# Patient Record
Sex: Male | Born: 1985 | Race: White | Hispanic: No | Marital: Single | Smoking: Current some day smoker
Health system: Southern US, Community
[De-identification: ages and names within clinical notes are randomized; demographics above are authoritative.]

## PROBLEM LIST (undated history)

## (undated) DIAGNOSIS — R569 Unspecified convulsions: Secondary | ICD-10-CM

---
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
## 2007-03-07 ENCOUNTER — Ambulatory Visit: Payer: Self-pay | Admitting: Thoracic Surgery

## 2007-04-25 ENCOUNTER — Ambulatory Visit: Payer: Self-pay | Admitting: Thoracic Surgery

## 2007-10-08 IMAGING — CT CT CHEST W/O CM
2 of 3 series · 16 of 36 positions shown, 20 images · IV contrast (agent unspecified)
Comparison: Chest x-ray 07/07/06.

CLINICAL DATA: Left anterior rib pain.  
 CT CHEST WITHOUT CONTRAST:
TECHNIQUE: Multidetector CT imaging of the chest was performed following the standard protocol without IV contrast.

[Series 3: routine chest · axial · 0.70mm/px · z∈[-351,-46]mm · 13 of 70 slices shown, 17 images]
[im 6/70  mediastinal]
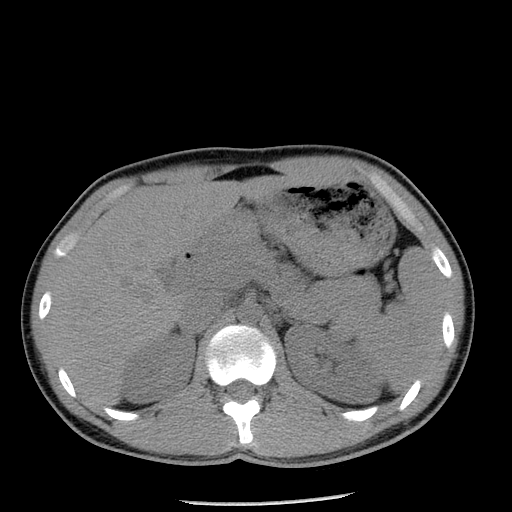
[im 6/70  lung]
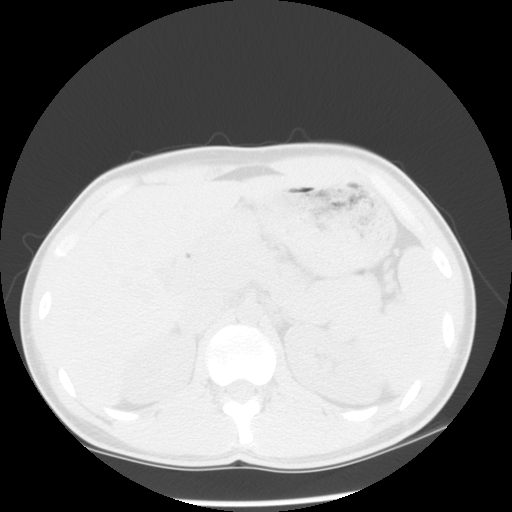
[im 11/70  lung]
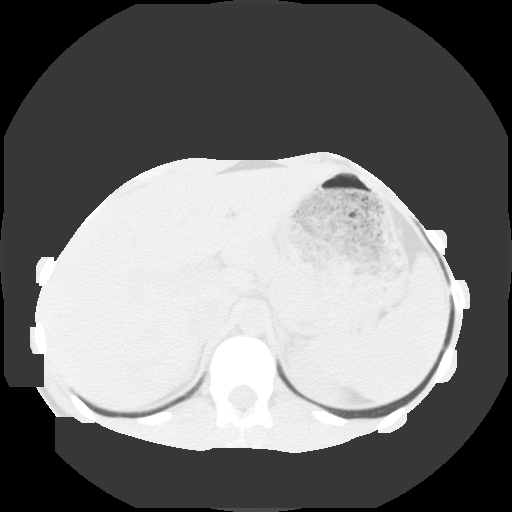
[im 16/70  lung]
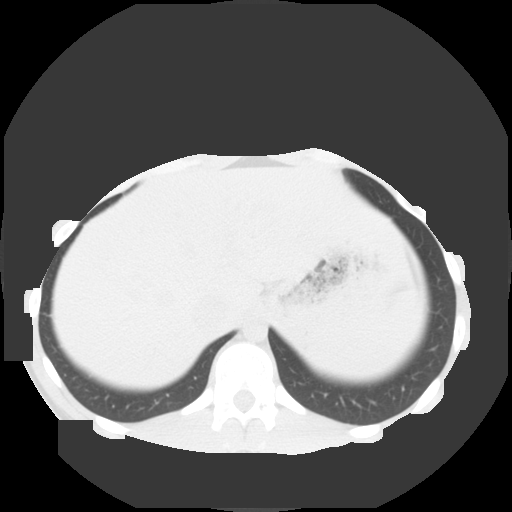
[im 21/70  lung]
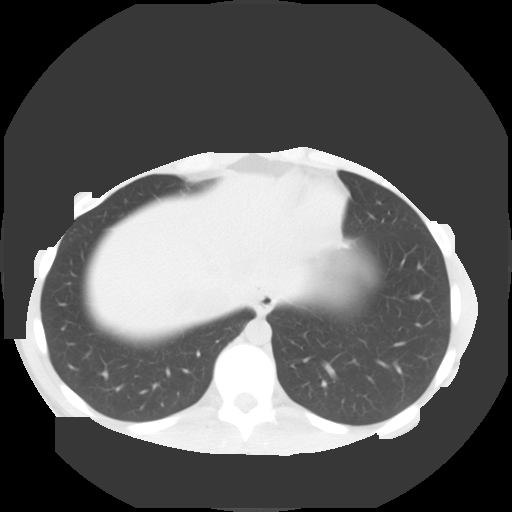
[im 26/70  mediastinal]
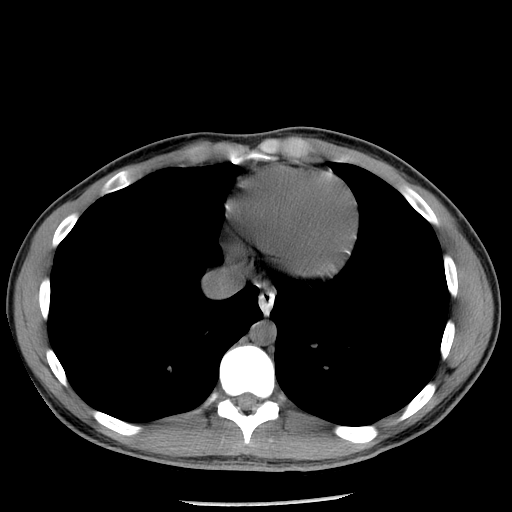
[im 26/70  lung]
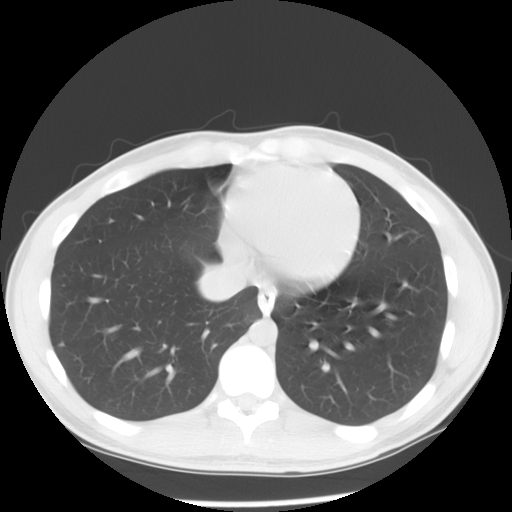
[im 31/70  lung]
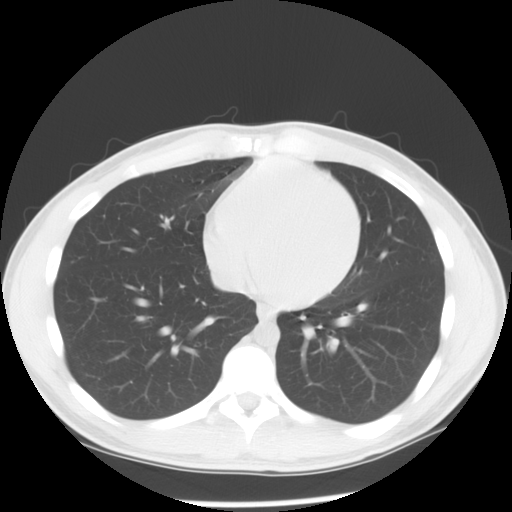
[im 36/70  lung]
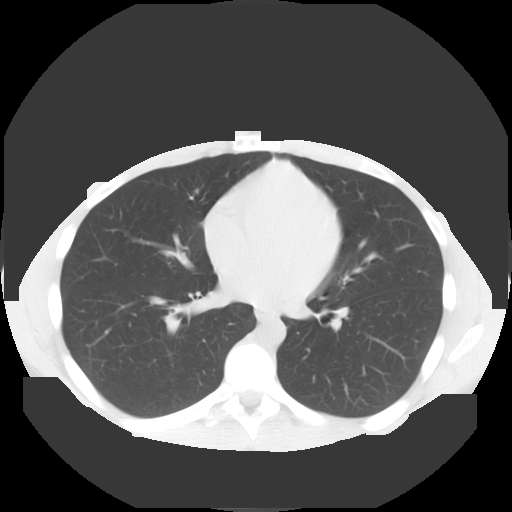
[im 41/70  lung]
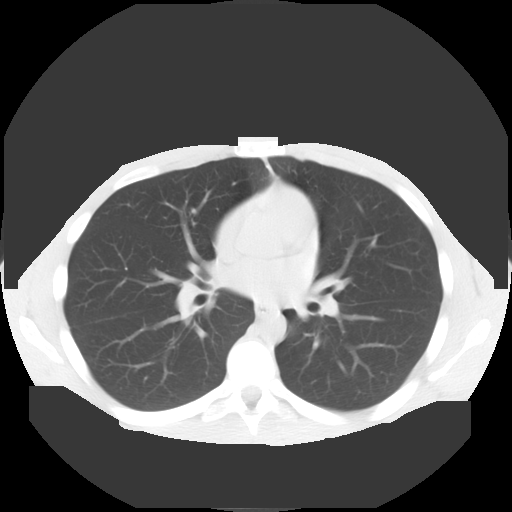
[im 47/70  mediastinal]
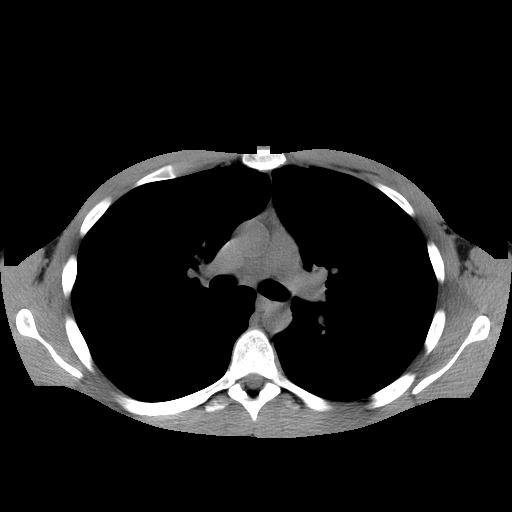
[im 47/70  lung]
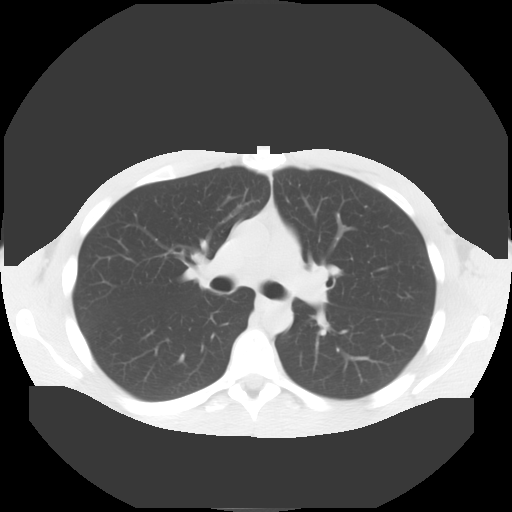
[im 52/70  lung]
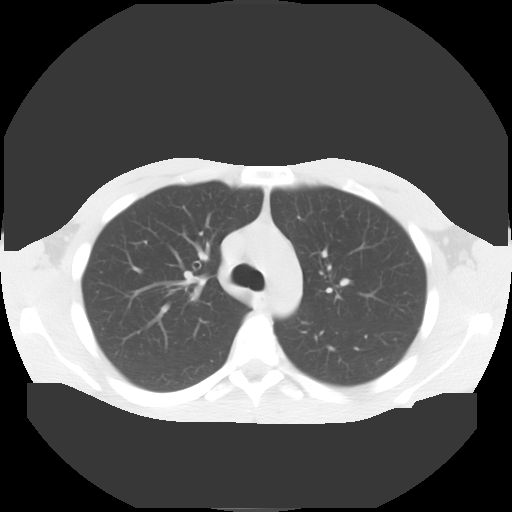
[im 57/70  lung]
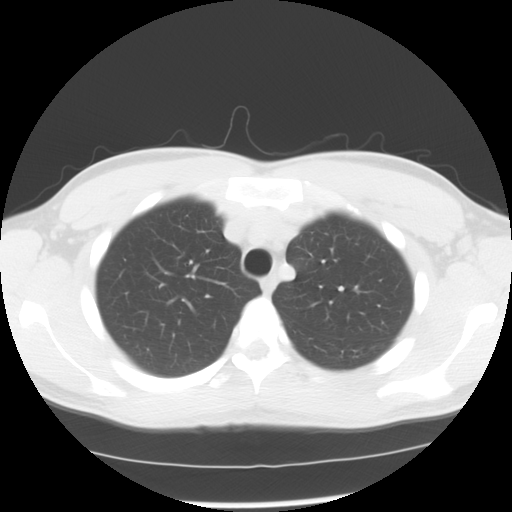
[im 62/70  lung]
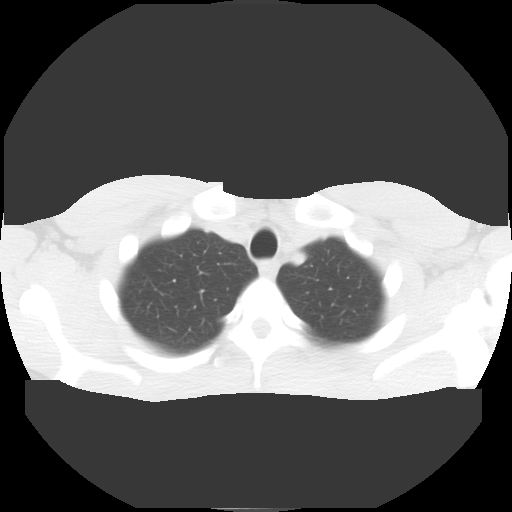
[im 67/70  mediastinal]
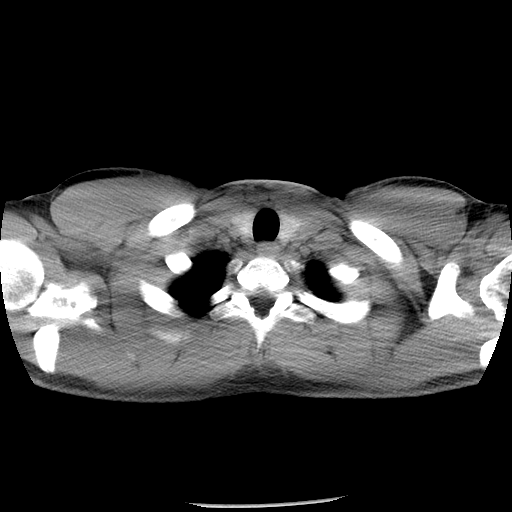
[im 67/70  lung]
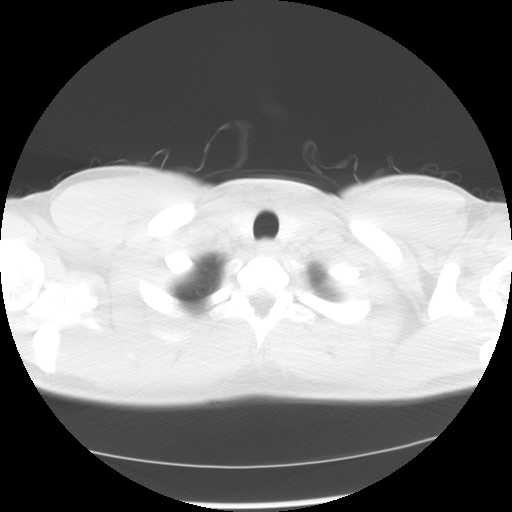

[Series 602: sagittal body · sagittal · 0.70mm/px · 3 of 145 slices shown]
[im 29/145  lung]
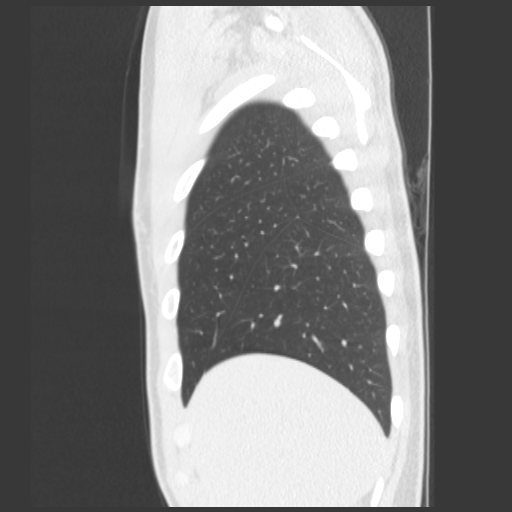
[im 58/145  lung]
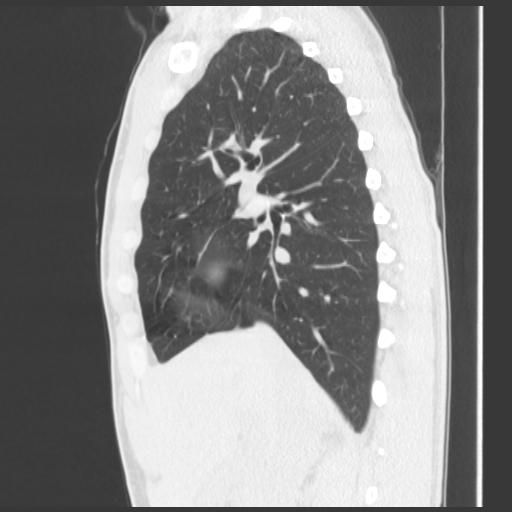
[im 87/145  lung]
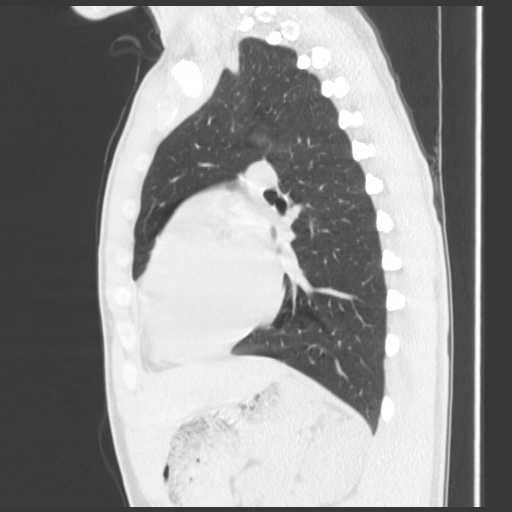

[16 of 36 positions shown; findings below may reference images not displayed]

FINDINGS: The lungs are clear without infiltrate, effusion, or mass.  The ribs appear normal with particular attention to the left ribs.  No mass, fracture, or other lesion is identified.  No soft tissue mass was identified.  The cardiac and mediastinal contours are normal.
IMPRESSION: Negative.

## 2011-02-01 NOTE — Letter (Signed)
March 07, 2007   Erasmo Leventhal, M.D.  [facility]  [address]  [address]  [place], [place] [address]   Re:  Edward Frost, Edward Frost                DOB:  08-08-1986   Dear Mardelle Matte:   I saw Mr. Alves in the office today.  You apparently did a right  shoulder surgery on him recently and he comes in complaining of left  lower costal margin pain and tenderness in his left costal margin.  He  was seen by Dr. Ethelene Hal who did multiple tests including thoracic and  lumbar MRI and a CT scan of the chest.  The patient says that his left  costal margin is more prominent than the right costal margin.  He has  had one episode of being on a Medrol Dosepak that he said helped some,  and he talks about having some type of injections in this particular  area.  No history of erythema or trauma.   PAST MEDICAL HISTORY:  Significant for a fractured jaw, recent diagnosis  of depression, apparently has been diagnosed with adult hyperactivity  disorder (ADHD).  His that he takes are Aleve twice a day, he is on  Lexapro 20 mg a day, Adderall 10 mg a day, and doxycycline 5 mg a day,  and multivitamins.  He has had no allergies.   FAMILY HISTORY:  Noncontributory.  He is single, works as a Land and part-time work.  Does not smoke, quit smoking 2 years ago,  and does drink alcohol on a regular basis.   REVIEW OF SYSTEMS:  Weight 178 pounds.  He is 6 feet.  CARDIAC:  No  angina or atrial fibrillation.  PULMONARY:  No hemoptysis, fever,  chills, productive cough or asthma.  GI:  No nausea, vomiting,  constipation, GERD, or peptic ulcer disease.  GU:  No dysuria, frequent  urination, or kidney disease.  VASCULAR:  No claudication, DVT, TIAs.  NEUROLOGIC:  No dizziness, headache, blackout, or seizures.  MUSCULOSKELETAL:  See history of present illness.  PSYCHIATRIC:  See  past medical history.  EYE/ENT:  No change in his eyesight or hearing.  HEMATOLOGIC:  No problems with bleeding.   PHYSICAL EXAMINATION:  GENERAL:  This is a well-developed Caucasian male  in no acute distress.  VITAL SIGNS:  His blood pressure is 119/66, pulse 88, respirations 18,  saturations were 98%.  HEENT:  Unremarkable.  CHEST:  Clear to auscultation and percussion.  HEART:  Regular sinus rhythm, no murmurs.  There is a slight prominence  of his left costal margin and some mild tenderness over the left costal  margin and the costal cartilage back to the anterior axillary line.  No  definite palpable masses.  No erythema.  No definite point tenderness.  ABDOMEN:  Soft.  There is no hepatosplenomegaly.  Pulses 2+.  There is  no clubbing or edema.  NEUROLOGIC:  He is oriented x3.  Sensory and motor are intact.  Cranial  nerves are intact.   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  I  explained to him that it takes a long time for this to resolve and is  best treated with nonsteroidal medication, occasionally steroids or  trigger point injections.  I gave him a prescription for Celebrex 200 mg  twice a day and will see him back in 3 weeks to check on his status.  Ines Bloomer, M.D.  Electronically Signed   DPB/MEDQ  D:  03/07/2007  T:  03/07/2007  Job:  [id number]   cc:   Caralyn Guile. Ethelene Hal, M.D.

## 2011-02-01 NOTE — Letter (Signed)
April 25, 2007   _Dr. Valma Cava   Re:  Edward Frost, Edward Frost                DOB:  1986/01/24   Dear Dr. Thomasena Edis :   I saw Edward Frost back in the office today.  His blood pressure is  124/83, pulse 88, respirations 18, temperature 98%.  He is still  complaining of costal pain but only on the left side and not as much on  the right and some back pain.  The Celebrex has stabilized it, but it  has not completely relieved it and increases with activity.  He does say  that when he got some injections that that helped for a short period of  time as well as a Medrol Dosepak.  So, I told him to continue on his  Celebrex twice a day and would see him back in 4 weeks, but I did tell  him again that this would take a long time for this to resolve.   Sincerely,   Ines Bloomer, M.D.  Electronically Signed   DPB/MEDQ  D:  04/25/2007  T:  04/26/2007  Job:  [id number]

## 2011-02-04 NOTE — Op Note (Signed)
[name], Edward Frost              ACCOUNT NO.:  [id number]   MEDICAL RECORD NO.:  [id number]          PATIENT TYPE:  AMB   LOCATION:  DSC                          FACILITY:  [facility]   PHYSICIAN:  Kinnie Scales. Annalee Genta, M.D.DATE OF BIRTH:  [birth year]   DATE OF PROCEDURE:  04/18/2005  DATE OF DISCHARGE:                                 OPERATIVE REPORT   PRE- AND POSTOPERATIVE DIAGNOSES:  1.  Mandibular fracture (Feb 13, 2005).  2.  Status post open reduction with mandibular maxillary fixation.   SURGICAL PROCEDURES:  Removal of mandibular maxillary fixation hardware.   SURGEON:  Dr. Annalee Genta.   ANESTHESIA:  MAC/local.   No specimen.   BLOOD LOSS:  Minimal.   Patient transferred to the operating room to recovery in stable condition.  No complications.   BRIEF HISTORY:  Zac is a 25 year old white male who suffered a mandibular  fracture while playing baseball. He was seen on Feb 13, 2005 and underwent  open reduction and mandibular maxillary fixation of mandible fracture on the  night of his initial injury.  Unfortunately,  the patient developed severe  gastritis with nausea and vomiting.  Mandibular wiring had to be released  and he was returned the operating room approximately 1 week later for  replacement of his mandibular maxillary wiring.  The patient did well  subsequently and tolerated liquid oral diet without complication or  difficulty.  Approximately six weeks after original injury, follow-up  Panorex was performed which showed good reduction of the fracture and he was  scheduled for removal of mandibular maxillary hardware. Risks, benefits and  possible complications of this procedure were discussed in detail with his  parents who understood and concurred with our plan for surgery which was  scheduled as above.   SURGICAL PROCEDURE:  The patient was brought to the operating room on April 18, 2005, placed in supine position on the operating table.  Monitored  anesthesia  care was administered via intravenous sedation.  The patient was  injected with approximately 3 mL of 1% lidocaine which was injected in the  soft tissue mucosa in the mandible and maxilla overlying the surgical  screws.  After allowing adequate time for vasoconstriction and hemostasis,  individual separate stab incisions were created overlying the screws and the  stainless steel hardware was removed  without difficulty. The oral cavity and oropharynx were then irrigated and  suctioned.  Airway was stable throughout the surgical procedure. The patient  then awakened from the anesthetic and transferred to the operating room to  recovery room in stable condition. No complications. Blood loss minimal.       DLS/MEDQ  D:  16/06/9603  T:  04/18/2005  Job:  [id number]

## 2011-02-04 NOTE — Op Note (Signed)
NAMEROBY, DONAWAY              ACCOUNT NO.:  [id number]   MEDICAL RECORD NO.:  [id number]          PATIENT TYPE:  AMB   LOCATION:  DSC                          FACILITY:  [facility]   PHYSICIAN:  Kinnie Scales. Annalee Genta, M.D.DATE OF BIRTH:  August 13, 1986   DATE OF PROCEDURE:  02/23/2005  DATE OF DISCHARGE:                                 OPERATIVE REPORT   PRE- AND POSTOPERATIVE DIAGNOSIS:  1.  Mandibular fracture.  2.  Status post open reduction and mandibular maxillary fixation (Feb 13 2005).   SURGICAL PROCEDURE:  Rewiring of mandibular maxillary fixation.   ANESTHESIA:  General nasal tracheal.   SURGEON:  Dr. Annalee Genta.   COMPLICATIONS:  None.   BLOOD LOSS:  Less than 50 cc.   Patient transferred from the operating room to recovery in stable condition.   BRIEF HISTORY:  Edward Frost is a 25 year old white male who suffered a right  anterior mandibular fracture on Feb 13, 2005 while playing baseball. He was  evaluated in the emergency department at [facility], he underwent  open reduction and mandibular maxillary fixation of mandibular fracture on  the evening of Feb 13, 2005.  The patient was discharged to home the  following morning and was stable until February 19, 2005.  The patient of acute  nausea and vomiting which necessitated cutting his mandibular maxillary  fixation. The patient had severe nausea and vomiting thought to be a viral  illness and he was admitted to [facility] in [place], under Dr.  Claudean Kinds care.  The patient had a mildly elevated white blood cell count  and low grade fever. He was placed on oral antibiotics and medications to  control nausea. He was well hydrated with intravenous fluids and was  discharged on the morning of February 23, 2005. The patient was returned the  operating room for open reduction and rewiring of mandibular maxillary  fixation as an outpatient at [facility] day surgical center.  The  risks, benefits and possible  complications of the above surgical procedure  were discussed in detail with Edward Frost and his parents and they understood  and concurred with our plan for surgery which was scheduled as above.   PROCEDURE:  The patient brought to the operating room on February 23, 2005 and  intravenous sedation was initiated.  The patient's oral cavity, oropharynx  were examined. The mandibular fracture was not significantly displaced and  the previously placed mandibulomaxillary screws were in good position and  localized.  In order to facilitate rewiring with the least amount of stress  on the patient, nasotracheal intubation was undertaken. Once adequate  anesthesia been established and airway stabilized, mandibulomaxillary  fixation was undertaken by rewiring with 24 and 26 gauge wires across the  previously placed fixation screws.  With the patient in good occlusion and  fixated, the patient's oral cavity and oropharynx were irrigated and  suctioned. Nasotracheal suction was undertaken and the  nasopharynx was thoroughly suctioned of bloody debris.  The patient was then  awakened  from the anesthetic and was extubated without difficulty.  He was  transferred from the operating room to recovery in stable condition. No  complications. Blood loss less than 50 cc.      DLS/MEDQ  D:  16/06/9603  T:  02/23/2005  Job:  [id number]

## 2011-02-04 NOTE — Discharge Summary (Signed)
NAMETRIP, CAVANAGH              ACCOUNT NO.:  [id number]   MEDICAL RECORD NO.:  [id number]          PATIENT TYPE:  INP   LOCATION:  6120                         FACILITY:  [facility]   PHYSICIAN:  Kinnie Scales. Annalee Genta, M.D.DATE OF BIRTH:  09-14-1986   DATE OF ADMISSION:  02/13/2005  DATE OF DISCHARGE:  02/14/2005                                 DISCHARGE SUMMARY   ADMISSION DIAGNOSIS:  Right mandibular fracture.   DISCHARGE DIAGNOSIS:  Right mandibular fracture.   SURGICAL PROCEDURE:  Open reduction, mandibulomaxillary fixation (Feb 13, 2005).  The patient discharged to home in stable condition in the company of  his parents.   DISCHARGE MEDICATIONS:  1.  Augmentin 40 mg per 5 mL 2 tsp b.i.d. x 10 days.  2.  Lortab elixir 2-3 tsp q.4-6h. p.r.n. pain, dispense 300 mL without      refill.  3.  Also use a liquid multivitamin.  4.  Liquid Motrin 600 mg p.o. t.i.d.  5.  Claritin p.r.n.   The patient has dietary restrictions, clear and thin liquids only.  Activities limited.  No lifting, straining, or vigorous physical exercise.  No trauma to the jaw.  No driving for 2 weeks.   MOUTH CARE:  Half-strength hydrogen peroxide mouth rinse on a twice daily  basis and gentle tooth brushing.   WIRE CUTTERS DISCHARGED WITH THE PATIENT AND TO BE KEPT WITH THE PATIENT AT  ALL TIMES.   The patient will follow up in my office in 2 weeks for postoperative care.  Phone number [phone] or sooner as needed for emergencies.   BRIEF HISTORY:  [name] is a 25 year old white male, who was playing baseball  in the evening of Feb 13, 2005.  He was struck in the right jaw with a  thrown baseball.  He presented to the [facility] Emergency  Department for evaluation, complaining of intraoral bleeding, swelling, and  significant discomfort.  Panorex x-ray was performed in the emergency room  which showed a partially displaced right mental mandibular fracture and a  minimally displaced right  subcondylar fracture.  Given the patient's  history, examination, and physical findings, I recommended that we undertake  open reduction and mandibulomaxillary fixation of the mandible fracture.  Risks, benefits, and possible complications of this procedure were discussed  in detail with the patient's parents, who understood and concurred with our  plan for surgery which was scheduled on a semi-urgent basis on the evening  of Feb 13, 2005.   HOSPITAL COURSE:  The patient was admitted to the ENT service under Dr.  Thurmon Fair care on Feb 13, 2005.  He was taken to the operating room at  [facility] and under general anesthesia, underwent uneventful,  uncomplicated open reduction mandibulomaxillary fixation of displaced  mandible fracture.  The patient was transferred from the operating room to  the recovery room in stable condition and from recovery to unit 6100 for  further postoperative care.   On the first postoperative morning, a Panorex was performed.  The patient  had good reduction of his fractures and good mandibular alignment.  He had  minimal swelling and complained of moderate discomfort which was controlled  with his narcotic pain medication.  The patient was tolerating a liquid diet  and was discharged to home in stable condition with the above discharge  instructions in the company of his parents.  His family is comfortable with  his discharge planning and will follow up in 2 weeks for postoperative care  or sooner as warranted.      DLS/MEDQ  D:  16/06/9603  T:  02/14/2005  Job:  [id number]

## 2011-02-04 NOTE — Op Note (Signed)
Edward Frost, Edward Frost              ACCOUNT NO.:  [id number]   MEDICAL RECORD NO.:  [id number]          PATIENT TYPE:  INP   LOCATION:  1824                         FACILITY:  [facility]   PHYSICIAN:  Kinnie Scales. Annalee Genta, M.D.DATE OF BIRTH:  07-03-1986   DATE OF PROCEDURE:  02/13/2005  DATE OF DISCHARGE:                                 OPERATIVE REPORT   PREOPERATIVE DIAGNOSIS:  Displaced right mandibular fracture.   POSTOPERATIVE DIAGNOSIS:  Displaced right mandibular fracture.   INDICATION FOR SURGERY:  Displaced right mandibular fracture.   SURGICAL PROCEDURES:  Open reduction and mandibulomaxillary fixation of  mandibular fracture.   ANESTHESIA:  General nasotracheal.   SURGEON:  David L. Annalee Genta, M.D.   COMPLICATIONS:  None.   ESTIMATED BLOOD LOSS:  Less than 50 mL.   CONDITION:  Patient transferred to the operating room to the recovery room  in stable condition.   BRIEF HISTORY:  [name] is a 25 year old white male who presented to the [facility] Emergency Department on Feb 13, 2005 after suffering an injury  while playing baseball.  The patient was struck in the region of the right  chin by thrown baseball. He had acute bleeding from the oral cavity and  pain; he was brought to the [facility] Emergency Department and  Panorex x-ray showed a displaced open right anterior mandibular fracture and  a right subcondylar fracture which was minimally displaced.  Given the  patient's history, examination and findings, I recommended that we undertake  open reduction and fixation of the fracture.  Examination in the ER showed  an intraoral laceration with minimal bleeding, moderate amount of perioral  swelling in the right submental and perimandibular region; no other acute  injuries were noted and there were no fractured, loose or broken teeth.  The  risk, benefits and possible complications of the above surgical procedure  were discussed with the patient and his  parents, and he was brought to the  operating room [facility] for the above surgical procedure.   PROCEDURE:  The patient was brought to the operating room on Feb 13, 2005  and placed in supine position on the operating table.  Nasotracheal  intubation was achieved without complication or difficulty.  When the  patient's airway had been secured, his oral cavity and oropharynx were  thoroughly irrigated and suctioned.  He was found to have a through-and-  through laceration in the right alveolus extending through the mucosa; no  other fractures or broken teeth were noted.  The patient had excellent  dentition and had good occlusion was achieved out difficulty.  When adequate  reduction had been obtained, mandibulomaxillary fixation was undertaken.  Screws were placed by creating mucosal incisions with a Bovie electrocautery  that was carried through the mucosa, underlying submucosa and the periosteum  was elevated over the anterior maxillary buttress and anterior mandible  bilaterally.  Titanium fixation screws were placed in each of the 4  locations and 24-gauge wire was used to secure the reduction.  There was  still some mobility in the posterior fragment on the right-hand side  and  fifth incision was made in the gingivobuccal sulcus, carried inferiorly to  avoid the mental foramen and a fifth titanium screw was placed at the  inferior aspect of the right lateral mandible.  An additional 24-gauge wire  was then used to secure this to the upper right anterior maxillary buttress.  There was excellent occlusion and good fixation.  An oral gastric tube was  passed.  Oral cavity was irrigated and suctioned.  The patient was then  awakened from the anesthetic, extubated and transferred to the operating  room to recovery room in stable condition.  There were no complications.  The blood loss was less 50 mL.      DLS/MEDQ  D:  04/54/0981  T:  02/14/2005  Job:  [id number]

## 2016-12-15 ENCOUNTER — Inpatient Hospital Stay (HOSPITAL_COMMUNITY)
Admission: EM | Admit: 2016-12-15 | Discharge: 2016-12-21 | DRG: 897 | Disposition: A | Discharge status: Home / Self Care | Payer: No Typology Code available for payment source | Source: Intra-hospital | Attending: Psychiatry | Admitting: Psychiatry

## 2016-12-15 ENCOUNTER — Encounter (HOSPITAL_COMMUNITY): Payer: Self-pay | Admitting: *Deleted

## 2016-12-15 DIAGNOSIS — F1490 Cocaine use, unspecified, uncomplicated: Secondary | ICD-10-CM | POA: Diagnosis not present

## 2016-12-15 DIAGNOSIS — F17210 Nicotine dependence, cigarettes, uncomplicated: Secondary | ICD-10-CM | POA: Diagnosis present

## 2016-12-15 DIAGNOSIS — F1220 Cannabis dependence, uncomplicated: Secondary | ICD-10-CM | POA: Diagnosis present

## 2016-12-15 DIAGNOSIS — F1420 Cocaine dependence, uncomplicated: Secondary | ICD-10-CM | POA: Diagnosis present

## 2016-12-15 DIAGNOSIS — R45851 Suicidal ideations: Secondary | ICD-10-CM

## 2016-12-15 DIAGNOSIS — F1914 Other psychoactive substance abuse with psychoactive substance-induced mood disorder: Secondary | ICD-10-CM | POA: Diagnosis present

## 2016-12-15 DIAGNOSIS — F1020 Alcohol dependence, uncomplicated: Secondary | ICD-10-CM | POA: Diagnosis present

## 2016-12-15 DIAGNOSIS — F19129 Other psychoactive substance abuse with intoxication, unspecified: Secondary | ICD-10-CM | POA: Diagnosis present

## 2016-12-15 DIAGNOSIS — F10239 Alcohol dependence with withdrawal, unspecified: Secondary | ICD-10-CM | POA: Diagnosis present

## 2016-12-15 DIAGNOSIS — Z818 Family history of other mental and behavioral disorders: Secondary | ICD-10-CM

## 2016-12-15 DIAGNOSIS — Z560 Unemployment, unspecified: Secondary | ICD-10-CM

## 2016-12-15 DIAGNOSIS — F1994 Other psychoactive substance use, unspecified with psychoactive substance-induced mood disorder: Secondary | ICD-10-CM

## 2016-12-15 DIAGNOSIS — F1290 Cannabis use, unspecified, uncomplicated: Secondary | ICD-10-CM | POA: Diagnosis not present

## 2016-12-15 HISTORY — DX: Unspecified convulsions: R56.9

## 2016-12-15 MED ORDER — VITAMIN B-1 100 MG PO TABS
100.0000 mg | ORAL_TABLET | Freq: Once | ORAL | Status: AC
  Administered 2016-12-15: 100 mg via ORAL
  Filled 2016-12-15 (×2): qty 1

## 2016-12-15 MED ORDER — VITAMIN B-1 100 MG PO TABS
100.0000 mg | ORAL_TABLET | Freq: Every day | ORAL | Status: DC
Start: 2016-12-16 — End: 2016-12-21
  Administered 2016-12-16 – 2016-12-21 (×5): 100 mg via ORAL
  Filled 2016-12-15 (×9): qty 1

## 2016-12-15 MED ORDER — CARBAMAZEPINE 200 MG PO TABS
200.0000 mg | ORAL_TABLET | Freq: Three times a day (TID) | ORAL | Status: DC
Start: 2016-12-15 — End: 2016-12-16
  Administered 2016-12-15 – 2016-12-16 (×2): 200 mg via ORAL
  Filled 2016-12-15 (×9): qty 1

## 2016-12-15 MED ORDER — CHLORDIAZEPOXIDE HCL 25 MG PO CAPS
25.0000 mg | ORAL_CAPSULE | Freq: Four times a day (QID) | ORAL | Status: AC | PRN
  Administered 2016-12-15 – 2016-12-16 (×2): 25 mg via ORAL
  Filled 2016-12-15 (×2): qty 1

## 2016-12-15 MED ORDER — ADULT MULTIVITAMIN W/MINERALS CH
1.0000 | ORAL_TABLET | Freq: Every day | ORAL | Status: DC
  Administered 2016-12-15 – 2016-12-21 (×6): 1 via ORAL
  Filled 2016-12-15 (×11): qty 1

## 2016-12-15 MED ORDER — IBUPROFEN 400 MG PO TABS
400.0000 mg | ORAL_TABLET | Freq: Four times a day (QID) | ORAL | Status: DC | PRN
  Administered 2016-12-15 – 2016-12-17 (×2): 400 mg via ORAL
  Filled 2016-12-15 (×2): qty 1

## 2016-12-15 MED ORDER — HALOPERIDOL 5 MG PO TABS
5.0000 mg | ORAL_TABLET | Freq: Four times a day (QID) | ORAL | Status: DC | PRN
  Administered 2016-12-15 – 2016-12-20 (×7): 5 mg via ORAL
  Filled 2016-12-15 (×7): qty 1

## 2016-12-15 MED ORDER — HYDROXYZINE HCL 25 MG PO TABS
25.0000 mg | ORAL_TABLET | Freq: Four times a day (QID) | ORAL | Status: AC | PRN
  Administered 2016-12-15: 25 mg via ORAL
  Filled 2016-12-15: qty 1

## 2016-12-15 MED ORDER — THIAMINE HCL 100 MG/ML IJ SOLN: 100.0000 mg | Freq: Once | INTRAMUSCULAR | Status: DC

## 2016-12-15 MED ORDER — ONDANSETRON 4 MG PO TBDP: 4.0000 mg | ORAL_TABLET | Freq: Four times a day (QID) | ORAL | Status: AC | PRN

## 2016-12-15 MED ORDER — ACETAMINOPHEN 325 MG PO TABS: 650.0000 mg | ORAL_TABLET | Freq: Four times a day (QID) | ORAL | Status: DC | PRN

## 2016-12-15 MED ORDER — HALOPERIDOL LACTATE 5 MG/ML IJ SOLN: 5.0000 mg | Freq: Four times a day (QID) | INTRAMUSCULAR | Status: DC | PRN

## 2016-12-15 MED ORDER — LOPERAMIDE HCL 2 MG PO CAPS: 2.0000 mg | ORAL_CAPSULE | ORAL | Status: AC | PRN
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
## 2016-12-15 NOTE — Progress Notes (Signed)
Patient ID: Edward Frost, male   DOB: 1986/04/06, 31 y.o.   MRN: [id number]   Pt currently presents with an anxious affect and restless, fidgety behavior. Pt speech is pressured and rapid. Pt reports to writer that their goal is to "get back home to my kids." Pt states "I need to go to long term treatment when I leave here." Pt reports poor sleep PTA.   Pt provided with scheduled and as needed medications per providers orders. Pt's labs and vitals were monitored throughout the night. Pt given a 1:1 about emotional and mental status. Pt supported and encouraged to express concerns and questions. Pt educated on medications and alternative relaxation techniques.   Pt's safety ensured with 15 minute and environmental checks. Pt currently denies SI/HI and A/V hallucinations. Pt verbally agrees to seek staff if SI/HI or A/VH occurs and to consult with staff before acting on any harmful thoughts. Pt responds positively to as needed Haldol. Pt physical behavior less fidgety, speech slower, mood less agitated. Will continue POC.

## 2016-12-15 NOTE — Tx Team (Addendum)
Initial Treatment Plan 12/15/2016 2:53 PM Edward Frost [id number]    PATIENT STRESSORS: Financial difficulties Legal issue Loss of relationship with girlfriend Substance abuse   PATIENT STRENGTHS: Ability for insight Average or above average intelligence General fund of knowledge Motivation for treatment/growth Physical Health Supportive family/friends Work skills   PATIENT IDENTIFIED PROBLEMS: Substance abuse- "get off the drugs"  si                   DISCHARGE CRITERIA:  Ability to meet basic life and health needs Adequate post-discharge living arrangements Improved stabilization in mood, thinking, and/or behavior Medical problems require only outpatient monitoring Motivation to continue treatment in a less acute level of care Need for constant or close observation no longer present Reduction of life-threatening or endangering symptoms to within safe limits Safe-care adequate arrangements made Verbal commitment to aftercare and medication compliance Withdrawal symptoms are absent or subacute and managed without 24-hour nursing intervention  PRELIMINARY DISCHARGE PLAN: Outpatient therapy  PATIENT/FAMILY INVOLVEMENT: This treatment plan has been presented to and reviewed with the patient, Edward Frost, and/or family member,   The patient and family have been given the opportunity to ask questions and make suggestions.  Beatrix ShipperWright, Jan Martin, RN 12/15/2016, 2:53 PM
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
## 2016-12-16 MED ORDER — CARBAMAZEPINE ER 400 MG PO TB12
600.0000 mg | ORAL_TABLET | Freq: Every day | ORAL | Status: DC
  Administered 2016-12-17 – 2016-12-20 (×4): 600 mg via ORAL
  Filled 2016-12-16 (×7): qty 1

## 2016-12-16 MED ORDER — CARBAMAZEPINE ER 100 MG PO TB12
100.0000 mg | ORAL_TABLET | Freq: Every day | ORAL | Status: DC
  Administered 2016-12-17 – 2016-12-21 (×5): 100 mg via ORAL
  Filled 2016-12-16 (×8): qty 1

## 2016-12-16 MED ORDER — NICOTINE POLACRILEX 2 MG MT GUM: 2.0000 mg | CHEWING_GUM | OROMUCOSAL | Status: DC | PRN

## 2016-12-16 MED ORDER — NICOTINE 21 MG/24HR TD PT24
MEDICATED_PATCH | TRANSDERMAL | Status: AC
  Filled 2016-12-16: qty 1

## 2016-12-16 MED ORDER — NICOTINE 21 MG/24HR TD PT24
21.0000 mg | MEDICATED_PATCH | Freq: Every day | TRANSDERMAL | Status: DC
  Administered 2016-12-16 – 2016-12-18 (×3): 21 mg via TRANSDERMAL
  Filled 2016-12-16 (×4): qty 1

## 2016-12-16 NOTE — [facility] Group Notes (Signed)
[facility] LCSW Aftercare Discharge Planning Group Note   12/16/2016 10:57 AM  Participation Quality:  Invited. DID NOT ATTEND. Pt chose to remain in bed.    Kalle Bernath N Smart LCSW
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
## 2016-12-16 NOTE — Progress Notes (Signed)
Recreation Therapy Notes  Date: 12/16/16 Time: 0930 Location: 300 Hall Dayroom  Group Topic: Stress Management  Goal Area(s) Addresses:  Patient will verbalize importance of using healthy stress management.  Patient will identify positive emotions associated with healthy stress management.   Intervention: Stress Management  Activity :  Self Forgiveness.  LRT introduced the stress management technique of meditation.  LRT played a meditation focused on self forgiveness allowing patients to participate in the technique.  Patients were to follow along as the meditation played to engage in the technique.  Education:  Stress Management, Discharge Planning.   Education Outcome: Acknowledges edcuation/In group clarification offered/Needs additional education  Clinical Observations/Feedback: Pt did not attend group.   Emrik Erhard, LRT/CTRS         Vaden Becherer A 12/16/2016 11:52 AM 

## 2016-12-16 NOTE — [facility] Group Notes (Signed)
Patient did not attend AA group. 
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
## 2016-12-16 NOTE — [facility] Group Notes (Signed)
[facility] LCSW Group Therapy  12/16/2016 2:31 PM  Type of Therapy:  Group Therapy  Participation Level:  Did Not Attend-pt invited.   Summary of Progress/Problems: Feelings around Relapse. Group members discussed the meaning of relapse and shared personal stories of relapse, how it affected them and others, and how they perceived themselves during this time. Group members were encouraged to identify triggers, warning signs and coping skills used when facing the possibility of relapse. Social supports were discussed and explored in detail. Post Acute Withdrawal Syndrome (handout provided) was introduced and examined. Pt's were encouraged to ask questions, talk about key points associated with PAWS, and process this information in terms of relapse prevention.   Anias Bartol N Smart LCSW 12/16/2016, 2:31 PM

## 2016-12-16 NOTE — Tx Team (Signed)
Interdisciplinary Treatment and Diagnostic Plan Update  12/16/2016 Time of Session: 0930 Edward Frost MRN: [id number]  Principal Diagnosis: Substance induced mood disorder (HCC)  Secondary Diagnoses: Principal Problem:   Substance induced mood disorder (HCC)   Current Medications:  Current Facility-Administered Medications  Medication Dose Route Frequency Provider Last Rate Last Dose  . acetaminophen (TYLENOL) tablet 650 mg  650 mg Oral Q6H PRN Georgiann Cocker, MD      . carbamazepine (TEGRETOL) tablet 200 mg  200 mg Oral TID Georgiann Cocker, MD   200 mg at 12/15/16 1732  . chlordiazePOXIDE (LIBRIUM) capsule 25 mg  25 mg Oral Q6H PRN Georgiann Cocker, MD   25 mg at 12/15/16 2359  . haloperidol (HALDOL) tablet 5 mg  5 mg Oral Q6H PRN Georgiann Cocker, MD   5 mg at 12/15/16 2221   Or  . haloperidol lactate (HALDOL) injection 5 mg  5 mg Intramuscular Q6H PRN Georgiann Cocker, MD      . hydrOXYzine (ATARAX/VISTARIL) tablet 25 mg  25 mg Oral Q6H PRN Georgiann Cocker, MD   25 mg at 12/15/16 2140  . ibuprofen (ADVIL,MOTRIN) tablet 400 mg  400 mg Oral Q6H PRN Jackelyn Poling, NP   400 mg at 12/15/16 2359  . loperamide (IMODIUM) capsule 2-4 mg  2-4 mg Oral PRN Georgiann Cocker, MD      . multivitamin with minerals tablet 1 tablet  1 tablet Oral Daily Georgiann Cocker, MD   1 tablet at 12/15/16 1732  . nicotine polacrilex (NICORETTE) gum 2 mg  2 mg Oral PRN Jackelyn Poling, NP      . ondansetron (ZOFRAN-ODT) disintegrating tablet 4 mg  4 mg Oral Q6H PRN Georgiann Cocker, MD      . thiamine (VITAMIN B-1) tablet 100 mg  100 mg Oral Daily Georgiann Cocker, MD       PTA Medications: Prescriptions Prior to Admission  Medication Sig Dispense Refill Last Dose  . amphetamine-dextroamphetamine (ADDERALL) 15 MG tablet Take 15 mg by mouth 2 (two) times daily.   12/14/2016 at Unknown time  . Multiple Vitamin (MULTIVITAMIN WITH MINERALS) TABS tablet Take 1 tablet by mouth daily.    12/14/2016 at Unknown time    Patient Stressors: Financial difficulties Legal issue Loss of relationship with girlfriend Substance abuse  Patient Strengths: Ability for insight Average or above average intelligence General fund of knowledge Motivation for treatment/growth Physical Health Supportive family/friends Work skills  Treatment Modalities: Medication Management, Group therapy, Case management,  1 to 1 session with clinician, Psychoeducation, Recreational therapy.   Physician Treatment Plan for Primary Diagnosis: Substance induced mood disorder (HCC) Long Term Goal(s):     Short Term Goals:    Medication Management: Evaluate patient's response, side effects, and tolerance of medication regimen.  Therapeutic Interventions: 1 to 1 sessions, Unit Group sessions and Medication administration.  Evaluation of Outcomes: Progressing  Physician Treatment Plan for Secondary Diagnosis: Principal Problem:   Substance induced mood disorder (HCC)  Long Term Goal(s):     Short Term Goals:       Medication Management: Evaluate patient's response, side effects, and tolerance of medication regimen.  Therapeutic Interventions: 1 to 1 sessions, Unit Group sessions and Medication administration.  Evaluation of Outcomes: Progressing   RN Treatment Plan for Primary Diagnosis: Substance induced mood disorder (HCC) Long Term Goal(s): Knowledge of disease and therapeutic regimen to maintain health will improve  Short Term Goals: Ability to remain free from  injury will improve, Ability to verbalize feelings will improve and Ability to disclose and discuss suicidal ideas  Medication Management: RN will administer medications as ordered by provider, will assess and evaluate patient's response and provide education to patient for prescribed medication. RN will report any adverse and/or side effects to prescribing provider.  Therapeutic Interventions: 1 on 1 counseling sessions,  Psychoeducation, Medication administration, Evaluate responses to treatment, Monitor vital signs and CBGs as ordered, Perform/monitor CIWA, COWS, AIMS and Fall Risk screenings as ordered, Perform wound care treatments as ordered.  Evaluation of Outcomes: Progressing   LCSW Treatment Plan for Primary Diagnosis: Substance induced mood disorder (HCC) Long Term Goal(s): Safe transition to appropriate next level of care at discharge, Engage patient in therapeutic group addressing interpersonal concerns.  Short Term Goals: Engage patient in aftercare planning with referrals and resources, Facilitate patient progression through stages of change regarding substance use diagnoses and concerns and Identify triggers associated with mental health/substance abuse issues  Therapeutic Interventions: Assess for all discharge needs, 1 to 1 time with Social worker, Explore available resources and support systems, Assess for adequacy in community support network, Educate family and significant other(s) on suicide prevention, Complete Psychosocial Assessment, Interpersonal group therapy.  Evaluation of Outcomes: Progressing   Progress in Treatment: Attending groups: Yes. Participating in groups: Yes. Taking medication as prescribed: Yes. Toleration medication: Yes. Family/Significant other contact made: No, will contact:  pt's mother/father prior to his discharge Patient understands diagnosis: Yes. Discussing patient identified problems/goals with staff: Yes. Medical problems stabilized or resolved: Yes. Denies suicidal/homicidal ideation: Yes. Issues/concerns per patient self-inventory: No. Other: n/a   New problem(s) identified: No, Describe:  n/a  New Short Term/Long Term Goal(s): detox; medication stabilization; decrease in depressive symptoms; development of comprehensive mental wellness/sobriety plan.   Discharge Plan or Barriers: CSW assessing for appropriate referrals. Pt is open to referral to  [facility] for outpatient medication management; counseling; substance abuse groups. He plans to move in with his parents at discharge since being evicted from ex-girlfriend's home.   Reason for Continuation of Hospitalization: Anxiety Depression Medication stabilization Withdrawal symptoms  Estimated Length of Stay: 3-5 days  Attendees: Patient: 12/16/2016 8:31 AM  Physician: Dr. Jackquline Berlin MD 12/16/2016 8:31 AM  Nursing: Foy Guadalaj[place]; Boyd Kerbs RN 12/16/2016 8:31 AM  RN Care Manager: Onnie Boer CM 12/16/2016 8:31 AM  Social Worker: Trula Slade, LCSW 12/16/2016 8:31 AM  Recreational Therapist: x 12/16/2016 8:31 AM  Other: Armandina Stammer NP 12/16/2016 8:31 AM  Other:  12/16/2016 8:31 AM  Other: 12/16/2016 8:31 AM    Scribe for Treatment Team: Ledell Peoples Smart, LCSW 12/16/2016 8:31 AM

## 2016-12-16 NOTE — Plan of Care (Signed)
Problem: Safety: Goal: Ability to remain free from injury will improve Outcome: Progressing Patient has remained free from injury this shift.   
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
## 2016-12-17 NOTE — Progress Notes (Signed)
Patient ID: Edward Frost, male   DOB: 07/13/1986, 31 y.o.   MRN: [id number]  Pt currently presents with an anxious affect and hyperactive behavior. Pt reports to writer that their goal was to "go home today." Pt states "but now that I know it is not happen so I am going to focus on helping my anxiety." Pt reports okay sleep with current medication regimen. Pt interacts positively with peers, attends group tonight.   Pt provided with medications per providers orders. Pt's labs and vitals were monitored throughout the night. Pt given a 1:1 about emotional and mental status. Pt supported and encouraged to express concerns and questions. Pt educated on medications.   Pt's safety ensured with 15 minute and environmental checks. Pt currently denies SI/HI and A/V hallucinations. Pt verbally agrees to seek staff if SI/HI or A/VH occurs and to consult with staff before acting on any harmful thoughts. Will continue POC.

## 2016-12-17 NOTE — [facility] Group Notes (Signed)
Adult Psychoeducational Group Note  Date:  12/17/2016 Time:  8:00 PM  Group Topic/Focus:  Wrap-Up Group:   The focus of this group is to help patients review their daily goal of treatment and discuss progress on daily workbooks.  Participation Level:  Active  Participation Quality:  Attentive  Affect:  Appropriate  Cognitive:  Alert and Appropriate  Insight: Good  Engagement in Group:  Engaged  Modes of Intervention:  Discussion and Education  Additional Comments:  Patient reported working on not "loosing it" and having a bad attitude.  Patient identified triggers, such as not having tobacco and his regular medicine, however confirmed feeling successful with using coping skills throughout the day.    Elmore Guise N 12/17/2016, 10:43 PM

## 2016-12-17 NOTE — Progress Notes (Signed)
Data. Patient denies SI/HI/AVH. Patient interacting well with staff and other patients, when out of bed, but spent most of the shift in his room in bed. Patient reported that he wanted to sign a 72 hour request for discharge this shift, but wanted to see if his parents would accept him back before he did. Parents did call the unit, and with patient consent, spoke to the nurse and the SW. Parents are very concerned about allowing him to come back to their home given his past behavior and his drug abuse. Patient continues to complain of not sleeping at night, but refused to stay up throughout the day, even after education about sleep patterns by the nurse. Affect continues to be irritable and flat alternatly. Minimal brightening with interaction.  Action. Emotional support and encouragement offered. Education provided on medication, indications and side effect. Q 15 minute checks done for safety. Response. Safety on the unit maintained through 15 minute checks.  Medications taken as prescribed.

## 2016-12-17 NOTE — [facility] Group Notes (Signed)
[facility] LCSW Group Therapy Note  12/17/2016 10:00 - 11:10 AM  Type of Therapy and Topic:  Group Therapy: Avoiding Self-Sabotaging and Enabling Behaviors  Participation Level:  Did Not Attend; invited to participate yet did not despite overhead announcement and encouragement by staff  Summary of Patient Progress: The main focus of today's process group was to explain to the adolescent what "self-sabotage" means and use Motivational Interviewing to discuss what benefits, negative or positive, were involved in a self-identified self-sabotaging behavior. We then talked about reasons the patient may want to change the behavior and their current desire to change.  Marquisha Nikolov C Ardith Lewman, LCSW  

## 2016-12-17 NOTE — [facility] Group Notes (Signed)
[facility] Group Notes:  (Nursing/MHT/Case Management/Adjunct)  Date:  12/17/2016  Time:  3:34 PM  Type of Therapy:  Nurse Education  Participation Level:  Did Not Attend   Almira Bar 12/17/2016, 3:34 PM

## 2016-12-17 NOTE — Progress Notes (Signed)
Patient ID: Edward Frost, male   DOB: 06/16/1986, 31 y.o.   MRN: [id number]   Pt currently presents with an agitated affect and anxious behavior. Pt reports to writer that their goal is to "get back on my medications from home (Adderall)." Pt states "I am going through withdrawals, I am just trying to sweat it out." Pt reports poor sleep with current medication regimen at night and oversedation during the day. Pt cooperative during interaction with Clinical research associate but remains in bed for most of the night and mood is irritable.   Pt provided with medications per providers orders. Pt's labs and vitals were monitored throughout the night. Pt given a 1:1 about emotional and mental status. Pt supported and encouraged to express concerns and questions. Pt educated on medications, therapeutic adherence and substance withdrawal. Verbalizes understanding.   Pt's safety ensured with 15 minute and environmental checks. Pt currently denies SI/HI and A/V hallucinations. Pt verbally agrees to seek staff if SI/HI or A/VH occurs and to consult with staff before acting on any harmful thoughts. Will continue POC.

## 2016-12-18 MED ORDER — NICOTINE POLACRILEX 2 MG MT GUM
2.0000 mg | CHEWING_GUM | OROMUCOSAL | Status: DC | PRN
  Administered 2016-12-18 – 2016-12-20 (×7): 2 mg via ORAL
  Filled 2016-12-18: qty 1
  Filled 2016-12-18: qty 6

## 2016-12-18 NOTE — Progress Notes (Signed)
Adult Psychoeducational Group Note  Date:  12/18/2016 Time:  1130 am  Group Topic/Focus:  Coping With Mental Health Crisis:   The purpose of this group is to help patients identify strategies for coping with mental health crisis.  Group discusses possible causes of crisis and ways to manage them effectively.  Participation Level:  Did Not Attend  Participation Quality:    Affect:    Cognitive:    Insight:   Engagement in Group:    Modes of Intervention:    Additional Comments:    Broghan Pannone L 12/18/2016, 3:02 PM

## 2016-12-18 NOTE — Progress Notes (Signed)
Patient ID: Trevor Wilkie, male   DOB: Jul 01, 1986, 31 y.o.   MRN: [id number]  Pt currently presents with a flat affect and euthymic mood. Pt reports to writer that their goal is to "go to this treatment facility for 15 days." Pt states "I think it is what I need to get sober." Pt reports good sleep with current medication regimen. Pt attends group tonight, becomes emotional and anxious. States "the guys story is very relatable."   Pt provided with medications per providers orders. Pt's labs and vitals were monitored throughout the night. Pt given a 1:1 about emotional and mental status. Pt supported and encouraged to express concerns and questions. Pt educated on medications. Pt encouraged to practice deep breathing during period of agitation/anxiety.   Pt's safety ensured with 15 minute and environmental checks. Pt mood is less labile tonight. Pt currently denies SI/HI and A/V hallucinations. Pt verbally agrees to seek staff if SI/HI or A/VH occurs and to consult with staff before acting on any harmful thoughts. Will continue POC.
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
## 2016-12-18 NOTE — [facility] Group Notes (Signed)
Patient attended AA group.   

## 2016-12-18 NOTE — [facility] Suicide Risk Assessment (Signed)
[facility] INPATIENT:  Family/Significant Other Suicide Prevention Education  Suicide Prevention Education:  Education Completed; Edward and Edward Frost (parents) [phone] has been identified by the patient as the family member/significant other with whom the patient will be residing, and identified as the person(s) who will aid the patient in the event of a mental health crisis (suicidal ideations/suicide attempt).  With written consent from the patient, the family member/significant other has been provided the following suicide prevention education, prior to the and/or following the discharge of the patient.  The suicide prevention education provided includes the following:  Suicide risk factors  Suicide prevention and interventions  National Suicide Hotline telephone number  [facility] assessment telephone number  [facility] Emergency Assistance 911  [facility] and/or Residential Mobile Crisis Unit telephone number  Request made of family/significant other to:  Remove weapons (e.g., guns, rifles, knives), all items previously/currently identified as safety concern.    Remove drugs/medications (over-the-counter, prescriptions, illicit drugs), all items previously/currently identified as a safety concern.  The family member/significant other verbalizes understanding of the suicide prevention education information provided.  The family member/significant other agrees to remove the items of safety concern listed above.  Edward Frost 12/18/2016, 5:55 PM
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
## 2016-12-18 NOTE — [facility] Group Notes (Signed)
[facility] LCSW Group Therapy Note   12/18/2016  10:10 - 11:10 AM   Type of Therapy and Topic: Group Therapy: Feelings Around Returning Home & Establishing a Supportive Framework and Activity to Identify signs of Improvement or Decompensation   Participation Level: Did Not Attend; invited to participate yet did not despite overhead announcement and encouragement by staff   Description of Group:  Patients first processed thoughts and feelings about up coming discharge. These included fears of upcoming changes, lack of change, new living environments, judgements and expectations from others and overall stigma of MH issues. We then discussed what is a supportive framework? What does it look like feel like and how do I discern it from and unhealthy non-supportive network? Learn how to cope when supports are not helpful and don't support you. Discuss what to do when your family/friends are not supportive.   Carney Bern, LCSW

## 2016-12-18 NOTE — Progress Notes (Signed)
Data. Patient denies SI/HI/AVH. Patient interacting well with staff and other patients. Patient has been up for the afternoon, though he did not get out of bed until lunch. Affect has been brighter this shift. Patient reports, "I think I can do treatment OP, but my family really wants me to go into a 28 day program. I can't afford that, neither can my family. We are not rich. We are just average Joes." On his self assessment patient reports 0/10 for depression and hopelessness and 5/10 for anxiety. His goal for today is, "Staying positive." Action. Emotional support and encouragement offered. Education provided on medication, indications and side effect. Q 15 minute checks done for safety. Response. Safety on the unit maintained through 15 minute checks.  Medications taken as prescribed. Remained calm and appropriate through out shift.

## 2016-12-19 NOTE — Progress Notes (Signed)
Recreation Therapy Notes  Date: 12/19/16 Time: 0930 Location: 300 Hall Dayroom  Group Topic: Stress Management  Goal Area(s) Addresses:  Patient will verbalize importance of using healthy stress management.  Patient will identify positive emotions associated with healthy stress management.   Behavioral Response: Engaged  Intervention: Stress Management  Activity :  LRT introduced the stress management technique of guided imagery.  LRT read a script to engage patients in guided imagery.  Patients were to follow along as the script was read in order to fully participate in the technique.  Education:  Stress Management, Discharge Planning.   Education Outcome: Acknowledges edcuation/In group clarification offered/Needs additional education  Clinical Observations/Feedback: Pt attended group.   Caroll Rancher, LRT/CTRS          Caroll Rancher A 12/19/2016 11:56 AM

## 2016-12-19 NOTE — Progress Notes (Signed)
[facility] and [facility] referrals made per pt request.  Trula Slade, MSW, LCSW Clinical Social Worker 12/19/2016 11:26 AM

## 2016-12-19 NOTE — [facility] Group Notes (Signed)
Pt attended spiritual care group on grief and loss facilitated by chaplain Reeta Kuk   Group opened with brief discussion and psycho-social ed around grief and loss in relationships and in relation to self - identifying life patterns, circumstances, changes that cause losses. Established group norm of speaking from own life experience. Group goal of establishing open and affirming space for members to share loss and experience with grief, normalize grief experience and provide psycho social education and grief support.     

## 2016-12-19 NOTE — Plan of Care (Signed)
Problem: Activity: Goal: Sleeping patterns will improve Outcome: Progressing Pt sleep times have improved this weekend with provided uninterrupted time for sleep  Problem: Education: Goal: Emotional status will improve Outcome: Progressing Pt mood is less labile today  Problem: Health Behavior/Discharge Planning: Goal: Identification of resources available to assist in meeting health care needs will improve Outcome: Progressing Pt able to verbalize medications and therapeutic effects; requests as needed medications for appropriate states  Problem: Safety: Goal: Periods of time without injury will increase Outcome: Progressing Pt remains safe at this time  Problem: Health Behavior/Discharge Planning: Goal: Ability to identify changes in lifestyle to reduce recurrence of condition will improve Outcome: Progressing Pt states "I need to go for treatment to be sober, I need my kids."  Problem: Physical Regulation: Goal: Complications related to the disease process, condition or treatment will be avoided or minimized Outcome: Progressing Provided symptom management medications, pt now requests independently
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
## 2016-12-19 NOTE — [facility] Group Notes (Signed)
[facility] LCSW Group Therapy  12/19/2016 12:51 PM  Type of Therapy:  Group Therapy  Participation Level:  Active  Participation Quality:  Attentive  Affect:  Appropriate  Cognitive:  Appropriate  Insight:  Engaged  Engagement in Therapy:  Improving  Modes of Intervention:  Confrontation, Discussion, Education, Problem-solving, Socialization and Support  Summary of Progress/Problems: Today's Topic: Overcoming Obstacles. Patients identified one short term goal and potential obstacles in reaching this goal. Patients processed barriers involved in overcoming these obstacles. Patients identified steps necessary for overcoming these obstacles and explored motivation (internal and external) for facing these difficulties head on.   Dorla Guizar N Smart LCSW 12/19/2016, 12:51 PM

## 2016-12-19 NOTE — Progress Notes (Signed)
Patient ID: Edward Frost, male   DOB: Jul 01, 1986, 30 y.o.   MRN: [id number]  DAR: Pt. Denies SI/HI and A/V Hallucinations. He is flat in affect but brightens on approach. He reports sleep is good, appetite is good, energy level is normal, and concentration is good. He rates depression, hopelessness, and anxiety 0/10. Patient does not report any pain or discomfort at this time. Support and encouragement provided to the patient. Scheduled medications and PRN medication administered to patient per physician's orders. Patient is minimal with Clinical research associate but cooperative. He is seen in the milieu at times but mostly seen laying in his bed with his eyes closed resting. Q15 minute checks are maintained for safety.

## 2016-12-20 DIAGNOSIS — F1290 Cannabis use, unspecified, uncomplicated: Secondary | ICD-10-CM

## 2016-12-20 DIAGNOSIS — Z79899 Other long term (current) drug therapy: Secondary | ICD-10-CM

## 2016-12-20 DIAGNOSIS — F17210 Nicotine dependence, cigarettes, uncomplicated: Secondary | ICD-10-CM

## 2016-12-20 DIAGNOSIS — F1420 Cocaine dependence, uncomplicated: Secondary | ICD-10-CM | POA: Diagnosis present

## 2016-12-20 DIAGNOSIS — F1490 Cocaine use, unspecified, uncomplicated: Secondary | ICD-10-CM

## 2016-12-20 DIAGNOSIS — F1020 Alcohol dependence, uncomplicated: Secondary | ICD-10-CM | POA: Diagnosis present

## 2016-12-20 DIAGNOSIS — F1220 Cannabis dependence, uncomplicated: Secondary | ICD-10-CM | POA: Diagnosis present

## 2016-12-20 MED ORDER — CARBAMAZEPINE ER 200 MG PO TB12: 600.0000 mg | ORAL_TABLET | Freq: Every day | ORAL | 0 refills | Status: AC

## 2016-12-20 MED ORDER — NICOTINE POLACRILEX 2 MG MT GUM: 2.0000 mg | CHEWING_GUM | OROMUCOSAL | 0 refills | Status: AC | PRN

## 2016-12-20 MED ORDER — CARBAMAZEPINE ER 100 MG PO TB12: 100.0000 mg | ORAL_TABLET | Freq: Every day | ORAL | 0 refills | Status: AC

## 2016-12-20 MED ORDER — ADULT MULTIVITAMIN W/MINERALS CH: 1.0000 | ORAL_TABLET | Freq: Every day | ORAL | 0 refills | Status: AC

## 2016-12-20 NOTE — BHH Suicide Risk Assessment (Signed)
[facility] Discharge Suicide Risk Assessment   [facility] Discharge Diagnoses:  Patient Active Problem List   Diagnosis Date Noted  . Alcohol use disorder, moderate, dependence (HCC) [F10.20] 12/20/2016  . Cocaine use disorder, moderate, dependence (HCC) [F14.20] 12/20/2016  . Cannabis use disorder, moderate, dependence (HCC) [F12.20] 12/20/2016  . Substance induced mood disorder (HCC) [F19.94] 12/15/2016    Total Time spent with patient: 20 minutes  Musculoskeletal: Strength & Muscle Tone: within normal limits Gait & Station: normal Patient leans: N/A  Psychiatric Specialty Exam: Review of Systems  Psychiatric/Behavioral: Positive for substance abuse. Negative for suicidal ideas.  All other systems reviewed and are negative.   Blood pressure 120/77, pulse 86, temperature 98.3 F (36.8 C), temperature source Oral, resp. rate 16, height 6' (1.829 m), weight 88.2 kg (194 lb 8 oz).Body mass index is 26.38 kg/m.  General Appearance: Casual  Eye Contact::  Fair  Speech:  Clear and Coherent409  Volume:  Normal  Mood:  Euthymic  Affect:  Appropriate  Thought Process:  Goal Directed and Descriptions of Associations: Intact  Orientation:  Full (Time, Place, and Person)  Thought Content:  Logical  Suicidal Thoughts:  No  Homicidal Thoughts:  No  Memory:  Immediate;   Fair Recent;   Fair Remote;   Fair  Judgement:  Fair  Insight:  Fair  Psychomotor Activity:  Normal  Concentration:  Fair  Recall:  [employer] of Knowledge:Fair  Language: Fair  Akathisia:  No  Handed:  Right  AIMS (if indicated):     Assets:  Communication Skills Desire for Improvement  Sleep:  Number of Hours: 6.25  Cognition: WNL  ADL's:  Intact   Mental Status Per Nursing Assessment::   On Admission:  Suicidal ideation indicated by patient  Demographic Factors:  Male and Caucasian  Loss Factors: NA  Historical Factors: Impulsivity  Risk Reduction  Factors:   Positive social support and Positive therapeutic relationship  Continued Clinical Symptoms:  Alcohol/Substance Abuse/Dependencies  Cognitive Features That Contribute To Risk:  None    Suicide Risk:  Minimal: No identifiable suicidal ideation.  Patients presenting with no risk factors but with morbid ruminations; may be classified as minimal risk based on the severity of the depressive symptoms  Follow-up Information    [facility] Follow up.   Why:  You have been accepted [facility] for Wed[facility] 12/21/16. Admitting MD: Dr. Alvester Morin. [facility] will transport you to facility on Wed morning. Thank you.  Contact information: [address] [place], [place] [address] Phone: [phone] Fax: [phone]          Plan Of Care/Follow-up recommendations:  Activity:  no restrictions Diet:  regular Tests:  as needed Other:  follow up with after care  Karole Oo, MD 12/20/2016, 3:22 PM

## 2016-12-20 NOTE — Tx Team (Signed)
Interdisciplinary Treatment and Diagnostic Plan Update  12/20/2016 Time of Session: [place] MRN: [id number]  Principal Diagnosis: Substance induced mood disorder ([place])  Secondary Diagnoses: Principal Problem:   Substance induced mood disorder ([place])   Current Medications:  Current Facility-Administered Medications  Medication Dose Route Frequency Provider Last Rate Last Dose  . acetaminophen (TYLENOL) tablet 650 mg  650 mg Oral Q6H PRN Artist Beach, MD      . carbamazepine (TEGRETOL XR) 12 hr tablet 100 mg  100 mg Oral Daily Artist Beach, MD   100 mg at 12/20/16 0805  . carbamazepine (TEGRETOL XR) 12 hr tablet 600 mg  600 mg Oral QHS Artist Beach, MD   600 mg at 12/19/16 2129  . haloperidol (HALDOL) tablet 5 mg  5 mg Oral Q6H PRN Artist Beach, MD   5 mg at 12/19/16 2129   Or  . haloperidol lactate (HALDOL) injection 5 mg  5 mg Intramuscular Q6H PRN Artist Beach, MD      . ibuprofen (ADVIL,MOTRIN) tablet 400 mg  400 mg Oral Q6H PRN Rozetta Nunnery, NP   400 mg at 12/17/16 0640  . multivitamin with minerals tablet 1 tablet  1 tablet Oral Daily Artist Beach, MD   1 tablet at 12/20/16 0804  . nicotine polacrilex (NICORETTE) gum 2 mg  2 mg Oral PRN Jenne Campus, MD   2 mg at 12/19/16 1307  . thiamine (VITAMIN B-1) tablet 100 mg  100 mg Oral Daily Artist Beach, MD   100 mg at 12/20/16 0805   PTA Medications: Prescriptions Prior to Admission  Medication Sig Dispense Refill Last Dose  . amphetamine-dextroamphetamine (ADDERALL) 15 MG tablet Take 15 mg by mouth 2 (two) times daily.   12/14/2016 at Unknown time  . Multiple Vitamin (MULTIVITAMIN WITH MINERALS) TABS tablet Take 1 tablet by mouth daily.   12/14/2016 at Unknown time    Patient Stressors: Financial difficulties Legal issue Loss of relationship with girlfriend Substance abuse  Patient Strengths: Ability for insight Average or above average intelligence General fund of  knowledge Motivation for treatment/growth Physical Health Supportive family/friends Work skills  Treatment Modalities: Medication Management, Group therapy, Case management,  1 to 1 session with clinician, Psychoeducation, Recreational therapy.   Physician Treatment Plan for Primary Diagnosis: Substance induced mood disorder ([place]) Long Term Goal(s):     Short Term Goals:    Medication Management: Evaluate patient's response, side effects, and tolerance of medication regimen.  Therapeutic Interventions: 1 to 1 sessions, Unit Group sessions and Medication administration.  Evaluation of Outcomes: Met  Physician Treatment Plan for Secondary Diagnosis: Principal Problem:   Substance induced mood disorder ([place])  Long Term Goal(s):     Short Term Goals:       Medication Management: Evaluate patient's response, side effects, and tolerance of medication regimen.  Therapeutic Interventions: 1 to 1 sessions, Unit Group sessions and Medication administration.  Evaluation of Outcomes: Met  RN Treatment Plan for Primary Diagnosis: Substance induced mood disorder ([place]) Long Term Goal(s): Knowledge of disease and therapeutic regimen to maintain health will improve  Short Term Goals: Ability to remain free from injury will improve, Ability to verbalize feelings will improve and Ability to disclose and discuss suicidal ideas  Medication Management: RN will administer medications as ordered by provider, will assess and evaluate patient's response and provide education to patient for prescribed medication. RN will report any adverse and/or side effects to prescribing provider.  Therapeutic Interventions:  1 on 1 counseling sessions, Psychoeducation, Medication administration, Evaluate responses to treatment, Monitor vital signs and CBGs as ordered, Perform/monitor CIWA, COWS, AIMS and Fall Risk screenings as ordered, Perform wound care treatments as ordered.  Evaluation of Outcomes:  Met   LCSW Treatment Plan for Primary Diagnosis: Substance induced mood disorder ([place]) Long Term Goal(s): Safe transition to appropriate next level of care at discharge, Engage patient in therapeutic group addressing interpersonal concerns.  Short Term Goals: Engage patient in aftercare planning with referrals and resources, Facilitate patient progression through stages of change regarding substance use diagnoses and concerns and Identify triggers associated with mental health/substance abuse issues  Therapeutic Interventions: Assess for all discharge needs, 1 to 1 time with Social worker, Explore available resources and support systems, Assess for adequacy in community support network, Educate family and significant other(s) on suicide prevention, Complete Psychosocial Assessment, Interpersonal group therapy.  Evaluation of Outcomes: Met   Progress in Treatment: Attending groups: Yes. Participating in groups: Yes. Taking medication as prescribed: Yes. Toleration medication: Yes. Family/Significant other contact made: SPE completed with pt's parents including collateral information.  Patient understands diagnosis: Yes. Discussing patient identified problems/goals with staff: Yes. Medical problems stabilized or resolved: Yes. Denies suicidal/homicidal ideation: Yes. Issues/concerns per patient self-inventory: No. Other: n/a   New problem(s) identified: No, Describe:  n/a  New Short Term/Long Term Goal(s): detox; medication stabilization; decrease in depressive symptoms; development of comprehensive mental wellness/sobriety plan.   Discharge Plan or Barriers: Pt has been accepted to [facility] for Wed 12/21/16 at 12:00PM. Admitting MD; Dr. Gloriann Loan.   Reason for Continuation of Hospitalization: Medication management; depression  Estimated Length of Stay: 1 day. Discharge scheduled for WED morning. [employer] will transport directly to facility.   Attendees: Patient: 12/20/2016 9:47 AM  Physician: Dr.  Shea Evans MD 12/20/2016 9:47 AM  Nursing: Parthenia Ames RN 12/20/2016 9:47 AM  RN Care Manager: Lars Pinks CM 12/20/2016 9:47 AM  Social Worker: Maxie Better, LCSW 12/20/2016 9:47 AM  Recreational Therapist: x 12/20/2016 9:47 AM  Other: May Augustin NP 12/20/2016 9:47 AM  Other:  12/20/2016 9:47 AM  Other: 12/20/2016 9:47 AM    Scribe for Treatment Team: [place], LCSW 12/20/2016 9:47 AM

## 2016-12-20 NOTE — Progress Notes (Signed)
D: Patient pleasant and cooperative with care this shift and is noted to interact well with peers in the milieu. Patient with bright affect and laughing with peers, but with complaints of anxiety. Patient affect not congruent with reported anxious mood. A: Encourage staff/peer interaction, medication compliance, and group participation. Administer medications as ordered, maintain Q 15 minute safety checks.R: Pt compliant with medications and attended group session. Pt denies SI at this time and verbally contracts for safety. No signs/symptoms of distress noted.

## 2016-12-20 NOTE — Progress Notes (Signed)
  [facility] Adult Case Management Discharge Plan :  Will you be returning to the same living situation after discharge:  No.Pt accepted to [facility] for Wed at 12pm At discharge, do you have transportation home?: Yes,  Pt IVCed/patient will be transported to facility by [facility] on Wed morning. Do you have the ability to pay for your medications: Yes,  mental health  Release of information consent forms completed and submitted to medical records by CSW.  Patient to Follow up at: Follow-up Information    [facility] Follow up.   Why:  You have been accepted to [facility] for Wed. 12/21/16. Admitting MD: Dr. Alvester Morin. [facility] will transport you to facility on Wed morning. Thank you.  Contact information: [address] [place], [place] [address] Phone: [phone] Fax: [phone]          Next level of care provider has access to [facility] Link:no  Safety Planning and Suicide Prevention discussed: Yes,  SPE completed with pt's parents.  Have you used any form of tobacco in the last 30 days? (Cigarettes, Smokeless Tobacco, Cigars, and/or Pipes): Yes  Has patient been referred to the Quitline?: Patient refused referral  Patient has been referred for addiction treatment: Yes  Claud Gowan N Smart LCSW 12/20/2016, 9:45 AM
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
## 2016-12-20 NOTE — Progress Notes (Signed)
DAR NOTE: Pt present with flat affect and depressed mood in the unit. Pt has been in the day room interacting with peers. Pt denies physical pain, took all his meds as scheduled. As per self inventory, pt had a good night sleep, good appetite, normal energy, and good concentration. Pt rate depression at 0, hopeless ness at 3, and anxiety at 3. Pt's safety ensured with 15 minute and environmental checks. Pt currently denies SI/HI and A/V hallucinations. Pt verbally agrees to seek staff if SI/HI or A/VH occurs and to consult with staff before acting on these thoughts. Will continue POC.

## 2016-12-20 NOTE — [facility] Group Notes (Signed)
[facility] Group Notes:  (Nursing/MHT/Case Management/Adjunct)  Date:  12/20/2016  Time:  1:33 PM  Type of Therapy:  Psychoeducational Skills  Participation Level:  Did Not Attend  Participation Quality:  DID NOT ATTEND  Affect:  DID NOT ATTEND  Cognitive:  DID NOT ATTEND  Insight:  None  Engagement in Group:  DID NOT ATTEND  Modes of Intervention:  DID NOT ATTEND  Summary of Progress/Problems: Pt did not attend patient self inventory group.   Youssouf Shipley O Athony Coppa 12/20/2016, 1:33 PM 

## 2016-12-20 NOTE — Progress Notes (Signed)
Adult Psychoeducational Group Note  Date:  12/20/2016 Time:  12:10 AM  Group Topic/Focus:  Wrap-Up Group:   The focus of this group is to help patients review their daily goal of treatment and discuss progress on daily workbooks.  Participation Level:  Did Not Attend  Additional Comments:Pt did not attend group. Pt encouraged to attend group, remained in bed asleep. Karleen Hampshire Brittini 12/20/2016, 12:10 AM

## 2016-12-20 NOTE — [facility] Group Notes (Signed)
[facility] LCSW Group Therapy  12/20/2016 1:45 PM  Type of Therapy:  Group Therapy  Participation Level:  Active  Participation Quality:  Attentive  Affect:  Appropriate  Cognitive:  Alert and Oriented  Insight:  Improving  Engagement in Therapy:  Improving  Modes of Intervention:  Confrontation, Discussion, Education, Problem-solving, Socialization and Support  Summary of Progress/Problems: MHA Speaker came to talk about his personal journey with substance abuse and addiction. The pt processed ways by which to relate to the speaker. MHA speaker provided handouts and educational information pertaining to groups and services offered by the [facility].   Owenn Rothermel N Smart LCSW 12/20/2016, 1:45 PM
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
## 2016-12-21 NOTE — Progress Notes (Signed)
D: When asked about his day pt informed the writer that he's to be discharged tomorrow at 0800. Informed pt that Clinical research associate didn't have a time for discharge however, note did state that it would be in the am. Stated that he hopes this is his first and only time going away to rehab.    A:  Support and encouragement was offered. 15 min checks continued for safety.  R: Pt remains safe.

## 2016-12-21 NOTE — Progress Notes (Signed)
Patient ID: Edward Frost, male   DOB: [birth year], 31 y.o.   MRN: [id number]  DAR: Pt. Denies SI/HI and A/V Hallucinations. He reports sleep is fair, appetite is good, energy level is high, and concentration is good. He rates depression, hopelessness, and anxiety. Patient does not report any pain or discomfort at this time. Support and encouragement provided to the patient to come to staff with any questions or concerns. Scheduled medications administered to patient per physician's orders. Patient is receptive and cooperative. He is seen in the dayroom interacting with peers. His affect brightens on approach. He reports he feels ready for discharge today. Q15 minute checks are maintained for safety.

## 2016-12-21 NOTE — Progress Notes (Signed)
Adult Psychoeducational Group Note  Date:  12/21/2016 Time:  1:29 AM  Group Topic/Focus:  Wrap-Up Group:   The focus of this group is to help patients review their daily goal of treatment and discuss progress on daily workbooks.  Participation Level:  Did Not Attend  Additional Comments:  Pt did not attend wrap up group. Pt remained in bed asleep.  Karleen Hampshire Brittini 12/21/2016, 1:29 AM

## 2016-12-21 NOTE — Progress Notes (Signed)
Recreation Therapy Notes  Date: 12/21/16 Time: 0930 Location: [address] Dayroom  Group Topic: Stress Management  Goal Area(s) Addresses:  Patient will verbalize importance of using healthy stress management.  Patient will identify positive emotions associated with healthy stress management.   Behavioral Response: Engage  Intervention: Stress Management  Activity :  Progressive Muscle Relaxation.  LRT introduced the stress management technique of progressive muscle relaxation.  LRT read a script to allow patients to tense and relax each muscle group individually.  Patients were to follow along as LRT read script to engage in activity.  Education:  Stress Management, Discharge Planning.   Education Outcome: Acknowledges edcuation/In group clarification offered/Needs additional education  Clinical Observations/Feedback: Pt attended group.   Edward Frost, LRT/CTRS         Deshanda Molitor A 12/21/2016 12:12 PM 

## 2016-12-21 NOTE — Progress Notes (Signed)
Patient ID: Edward Frost, male   DOB: 10-Dec-1985, 31 y.o.   MRN: [id number]  Discharge Note: Belongings returned to patient at time of discharge. Discharge instructions and medications were reviewed with patient. Patient discharged to lobby where transport was waiting. Q15 minute safety checks maintained until discharge. No distress upon discharge.
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
